# Patient Record
Sex: Female | Born: 1990 | Race: Black or African American | Hispanic: No | Marital: Single | Smoking: Current some day smoker
Health system: Southern US, Community
[De-identification: ages and names within clinical notes are randomized; demographics above are authoritative.]

## PROBLEM LIST (undated history)

## (undated) DIAGNOSIS — R51 Headache: Secondary | ICD-10-CM

## (undated) DIAGNOSIS — R519 Headache, unspecified: Secondary | ICD-10-CM

## (undated) DIAGNOSIS — J45909 Unspecified asthma, uncomplicated: Secondary | ICD-10-CM

---
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
## 2008-03-01 ENCOUNTER — Emergency Department (HOSPITAL_COMMUNITY): Admission: EM | Admit: 2008-03-01 | Discharge: 2008-03-01 | Payer: Self-pay | Admitting: Emergency Medicine

## 2008-03-04 ENCOUNTER — Emergency Department (HOSPITAL_COMMUNITY): Admission: EM | Admit: 2008-03-04 | Discharge: 2008-03-04 | Payer: Self-pay | Admitting: Emergency Medicine

## 2008-03-10 ENCOUNTER — Emergency Department (HOSPITAL_COMMUNITY): Admission: EM | Admit: 2008-03-10 | Discharge: 2008-03-10 | Payer: Self-pay | Admitting: Emergency Medicine

## 2009-12-23 HISTORY — PX: OTHER SURGICAL HISTORY: SHX169

## 2011-09-30 LAB — RAPID STREP SCREEN (MED CTR MEBANE ONLY): Streptococcus, Group A Screen (Direct): NEGATIVE

## 2014-03-05 ENCOUNTER — Emergency Department (HOSPITAL_COMMUNITY)
Admission: EM | Admit: 2014-03-05 | Discharge: 2014-03-05 | Disposition: A | Discharge status: Home / Self Care | Payer: Self-pay | Attending: Emergency Medicine | Admitting: Emergency Medicine

## 2014-03-05 ENCOUNTER — Emergency Department (HOSPITAL_COMMUNITY): Payer: Self-pay

## 2014-03-05 ENCOUNTER — Encounter (HOSPITAL_COMMUNITY): Payer: Self-pay | Admitting: Emergency Medicine

## 2014-03-05 DIAGNOSIS — Y9239 Other specified sports and athletic area as the place of occurrence of the external cause: Secondary | ICD-10-CM | POA: Insufficient documentation

## 2014-03-05 DIAGNOSIS — S8391XA Sprain of unspecified site of right knee, initial encounter: Secondary | ICD-10-CM

## 2014-03-05 DIAGNOSIS — X500XXA Overexertion from strenuous movement or load, initial encounter: Secondary | ICD-10-CM | POA: Insufficient documentation

## 2014-03-05 DIAGNOSIS — Y9367 Activity, basketball: Secondary | ICD-10-CM | POA: Insufficient documentation

## 2014-03-05 DIAGNOSIS — Y92838 Other recreation area as the place of occurrence of the external cause: Secondary | ICD-10-CM

## 2014-03-05 DIAGNOSIS — IMO0002 Reserved for concepts with insufficient information to code with codable children: Secondary | ICD-10-CM | POA: Insufficient documentation

## 2014-03-05 MED ORDER — HYDROCODONE-ACETAMINOPHEN 5-325 MG PO TABS: 2.0000 | ORAL_TABLET | ORAL | Status: DC | PRN

## 2014-03-05 NOTE — Discharge Instructions (Signed)
Rest, ice for 20 minutes every 2 hours while awake for the next 2 days, elevate.  Wear knee immobilizer and use crutches.  Hydrocodone as needed for pain.  Followup with your primary Dr. if not improving in the next week to discuss physical therapy versus further imaging studies.   Knee Sprain A knee sprain is a tear in one of the strong, fibrous tissues that connect the bones (ligaments) in your knee. The severity of the sprain depends on how much of the ligament is torn. The tear can be either partial or complete. CAUSES  Often, sprains are a result of a fall or injury. The force of the impact causes the fibers of your ligament to stretch too much. This excess tension causes the fibers of your ligament to tear. SIGNS AND SYMPTOMS  You may have some loss of motion in your knee. Other symptoms include:  Bruising.  Pain in the knee area.  Tenderness of the knee to the touch.  Swelling. DIAGNOSIS  To diagnose a knee sprain, your health care provider will physically examine your knee. Your health care provider may also suggest an X-ray exam of your knee to make sure no bones are broken. TREATMENT  If your ligament is only partially torn, treatment usually involves keeping the knee in a fixed position (immobilization) or bracing your knee for activities that require movement for several weeks. To do this, your health care provider will apply a bandage, cast, or splint to keep your knee from moving and to support your knee during movement until it heals. For a partially torn ligament, the healing process usually takes 4 6 weeks. If your ligament is completely torn, depending on which ligament it is, you may need surgery to reconnect the ligament to the bone or reconstruct it. After surgery, a cast or splint may be applied and will need to stay on your knee for 4 6 weeks while your ligament heals. HOME CARE INSTRUCTIONS  Keep your injured knee elevated to decrease swelling.  To ease pain  and swelling, apply ice to the injured area:  Put ice in a plastic bag.  Place a towel between your skin and the bag.  Leave the ice on for 20 minutes, 2 3 times a day.  Only take medicine for pain as directed by your health care provider.  Do not leave your knee unprotected until pain and stiffness go away (usually 4 6 weeks).  If you have a cast or splint, do not allow it to get wet. If you have been instructed not to remove it, cover it with a plastic bag when you shower or bathe. Do not swim.  Your health care provider may suggest exercises for you to do during your recovery to prevent or limit permanent weakness and stiffness. SEEK IMMEDIATE MEDICAL CARE IF:  Your cast or splint becomes damaged.  Your pain becomes worse.  You have significant pain, swelling, or numbness below the cast or splint. MAKE SURE YOU:  Understand these instructions.  Will watch your condition.  Will get help right away if you are not doing well or get worse. Document Released: 12/09/2005 Document Revised: 09/29/2013 Document Reviewed: 07/21/2013 [facility] Patient Information 2014 [place], [place].

## 2014-03-05 NOTE — ED Provider Notes (Signed)
CSN: [id number]     Arrival date & time 03/05/14  0706 History   First MD Initiated Contact with Patient 03/05/14 [phone]     Chief Complaint  Patient presents with  . Knee Pain     (Consider location/radiation/quality/duration/timing/severity/associated sxs/prior Treatment) HPI Comments: Patient is a 23 year old female otherwise healthy who presents for evaluation of a knee injury. She is playing basketball yesterday and twisted her right knee awkwardly. She reports feeling something pop and has been having increased pain and swelling since that time. She has difficulty bearing weight. She denies any history of any prior knee problems.  Patient is a 23 y.o. female presenting with knee pain. The history is provided by the patient.  Knee Pain Location:  Knee Time since incident:  1 day Injury: yes   Knee location:  R knee Pain details:    Quality:  Sharp   Radiates to:  Does not radiate   Severity:  Moderate   Onset quality:  Sudden   Timing:  Constant   Progression:  Worsening Chronicity:  New Relieved by:  Nothing Worsened by:  Activity and bearing weight   History reviewed. No pertinent past medical history. History reviewed. No pertinent past surgical history. No family history on file. History  Substance Use Topics  . Smoking status: Never Smoker   . Smokeless tobacco: Not on file  . Alcohol Use: No   OB History   Grav Para Term Preterm Abortions TAB SAB Ect Mult Living                 Review of Systems  All other systems reviewed and are negative.      Allergies  Review of patient's allergies indicates no known allergies.  Home Medications   Current Outpatient Rx  Name  Route  Sig  Dispense  Refill  . acetaminophen (TYLENOL) 325 MG tablet   Oral   Take 650 mg by mouth every 6 (six) hours as needed.         Marland Kitchen ibuprofen (ADVIL,MOTRIN) 200 MG tablet   Oral   Take 200 mg by mouth every 6 (six) hours as needed for moderate pain.          BP 109/97   Pulse 91  Temp(Src) 98.3 F (36.8 C) (Oral)  Resp 20  Ht 5\' 9"  (1.753 m)  Wt 155 lb (70.308 kg)  BMI 22.88 kg/m2  SpO2 97% Physical Exam  Nursing note and vitals reviewed. Constitutional: She is oriented to person, place, and time. She appears well-developed and well-nourished. No distress.  HENT:  Head: Normocephalic and atraumatic.  Neck: Normal range of motion. Neck supple.  Musculoskeletal: She exhibits no edema.  The right knee is noted to have a small effusion present. There is tenderness to palpation over the medial collateral ligament. There is no significant laxity with varus or valgus stress. Anterior and posterior drawer tests are somewhat limited due to pain, however revealed no obvious laxity.  Neurological: She is alert and oriented to person, place, and time.  Skin: Skin is warm and dry. She is not diaphoretic.    ED Course  Procedures (including critical care time) Labs Review Labs Reviewed - No data to display Imaging Review No results found.    MDM   Final diagnoses:  None    Patient is a 23 year old female who injured her knee playing basketball. She appears to have a slight effusion, however there is no laxity to my exam. She has pain with range  of motion but there is no crepitus. She will be placed in a knee immobilizer, given crutches, and will be advised to ice, elevate, and rest her leg. She is not improving in the next week, she will require followup with her primary Dr. to discuss physical therapy versus further imaging.   Geoffery Lyonsouglas Konstantina Nachreiner, MD 03/05/14 [phone]

## 2014-03-05 NOTE — ED Notes (Signed)
Patient transported to X-ray 

## 2014-03-05 NOTE — Progress Notes (Signed)
Orthopedic Tech Progress Note Patient Details:  Rowland LatheMichela A Bollig 04/21/1991 [id number] Knee immobilizer applied. Crutches fit for height and comfort. Ortho Devices Type of Ortho Device: Knee Immobilizer;Crutches Ortho Device/Splint Location: Right LE Ortho Device/Splint Interventions: Application   Asia R Thompson 03/05/2014, 9:40 AM

## 2014-03-05 NOTE — ED Notes (Signed)
Pt presents to department for evaluation of R knee pain. States she injured knee while playing basketball last night. 9/10 pain, increases with weight bearing and movement. No obvious deformities noted. Pt is alert and oriented x4.

## 2014-11-28 ENCOUNTER — Encounter (HOSPITAL_COMMUNITY): Payer: Self-pay | Admitting: *Deleted

## 2014-11-28 ENCOUNTER — Emergency Department (INDEPENDENT_AMBULATORY_CARE_PROVIDER_SITE_OTHER)
Admission: EM | Admit: 2014-11-28 | Discharge: 2014-11-28 | Disposition: A | Discharge status: Home / Self Care | Payer: Self-pay | Source: Home / Self Care | Attending: Family Medicine | Admitting: Family Medicine

## 2014-11-28 DIAGNOSIS — G43909 Migraine, unspecified, not intractable, without status migrainosus: Secondary | ICD-10-CM

## 2014-11-28 HISTORY — DX: Unspecified asthma, uncomplicated: J45.909

## 2014-11-28 HISTORY — DX: Headache: R51

## 2014-11-28 HISTORY — DX: Headache, unspecified: R51.9

## 2014-11-28 MED ORDER — DEXAMETHASONE SODIUM PHOSPHATE 10 MG/ML IJ SOLN
10.0000 mg | Freq: Once | INTRAMUSCULAR | Status: AC
  Administered 2014-11-28: 10 mg via INTRAMUSCULAR

## 2014-11-28 MED ORDER — METOCLOPRAMIDE HCL 5 MG/ML IJ SOLN
5.0000 mg | Freq: Once | INTRAMUSCULAR | Status: AC
  Administered 2014-11-28: 5 mg via INTRAMUSCULAR

## 2014-11-28 MED ORDER — KETOROLAC TROMETHAMINE 60 MG/2ML IM SOLN
60.0000 mg | Freq: Once | INTRAMUSCULAR | Status: AC
  Administered 2014-11-28: 60 mg via INTRAMUSCULAR

## 2014-11-28 MED ORDER — METOCLOPRAMIDE HCL 5 MG/ML IJ SOLN
INTRAMUSCULAR | Status: AC
  Filled 2014-11-28: qty 2

## 2014-11-28 MED ORDER — KETOROLAC TROMETHAMINE 60 MG/2ML IM SOLN
INTRAMUSCULAR | Status: AC
  Filled 2014-11-28: qty 2

## 2014-11-28 MED ORDER — DEXAMETHASONE SODIUM PHOSPHATE 10 MG/ML IJ SOLN
INTRAMUSCULAR | Status: AC
  Filled 2014-11-28: qty 1

## 2014-11-28 MED ORDER — DICLOFENAC SODIUM 50 MG PO TBEC: 50.0000 mg | DELAYED_RELEASE_TABLET | Freq: Two times a day (BID) | ORAL | Status: AC | PRN

## 2014-11-28 NOTE — ED Notes (Signed)
Headache on R side onset today.  Has one 3-4 times/week.  No N or V, but does have photophobia.

## 2014-11-28 NOTE — Discharge Instructions (Signed)
Thank you for coming in today. Go to the emergency room if your headache becomes excruciating or you have weakness or numbness or uncontrolled vomiting.  Please call or see Ms Antionette CharMaggy Mena for assistance with your bill.  You may qualify for reduced or free services.  Her phone number is [phone]. Her email is [email or web address]   [facility] [address] [place], [place] [address] [phone]   Migraine Headache A migraine headache is an intense, throbbing pain on one or both sides of your head. A migraine can last for 30 minutes to several hours. CAUSES  The exact cause of a migraine headache is not always known. However, a migraine may be caused when nerves in the brain become irritated and release chemicals that cause inflammation. This causes pain. Certain things may also trigger migraines, such as:  Alcohol.  Smoking.  Stress.  Menstruation.  Aged cheeses.  Foods or drinks that contain nitrates, glutamate, aspartame, or tyramine.  Lack of sleep.  Chocolate.  Caffeine.  Hunger.  Physical exertion.  Fatigue.  Medicines used to treat chest pain (nitroglycerine), birth control pills, estrogen, and some blood pressure medicines. SIGNS AND SYMPTOMS  Pain on one or both sides of your head.  Pulsating or throbbing pain.  Severe pain that prevents daily activities.  Pain that is aggravated by any physical activity.  Nausea, vomiting, or both.  Dizziness.  Pain with exposure to bright lights, loud noises, or activity.  General sensitivity to bright lights, loud noises, or smells. Before you get a migraine, you may get warning signs that a migraine is coming (aura). An aura may include:  Seeing flashing lights.  Seeing bright spots, halos, or zigzag lines.  Having tunnel vision or blurred vision.  Having feelings of numbness or tingling.  Having trouble talking.  Having muscle  weakness. DIAGNOSIS  A migraine headache is often diagnosed based on:  Symptoms.  Physical exam.  A CT scan or MRI of your head. These imaging tests cannot diagnose migraines, but they can help rule out other causes of headaches. TREATMENT Medicines may be given for pain and nausea. Medicines can also be given to help prevent recurrent migraines.  HOME CARE INSTRUCTIONS  Only take over-the-counter or prescription medicines for pain or discomfort as directed by your health care provider. The use of long-term narcotics is not recommended.  Lie down in a dark, quiet room when you have a migraine.  Keep a journal to find out what may trigger your migraine headaches. For example, write down:  What you eat and drink.  How much sleep you get.  Any change to your diet or medicines.  Limit alcohol consumption.  Quit smoking if you smoke.  Get 7-9 hours of sleep, or as recommended by your health care provider.  Limit stress.  Keep lights dim if bright lights bother you and make your migraines worse. SEEK IMMEDIATE MEDICAL CARE IF:   Your migraine becomes severe.  You have a fever.  You have a stiff neck.  You have vision loss.  You have muscular weakness or loss of muscle control.  You start losing your balance or have trouble walking.  You feel faint or pass out.  You have severe symptoms that are different from your first symptoms. MAKE SURE YOU:   Understand these instructions.  Will watch your condition.  Will get help right away if you are not doing well or get worse. Document Released: 12/09/2005 Document Revised: 04/25/2014 Document Reviewed:  08/16/2013 °ExitCare® Patient Information ©2015 Exit[employer], LLC. This information is not intended to replace advice given to you by your health care provider. Make sure you discuss any questions you have with your health care provider. ° °

## 2014-11-28 NOTE — ED Provider Notes (Signed)
Brittney Duke is a 23 y.o. female who presents to Urgent Care today for Headache. Patient is a severe right-sided headache. This is been present starting today. She has a long history of migraines. Her current headache is similar to previous episodes of migraines. No fevers or chills nausea vomiting or diarrhea. No blurry vision. No trouble balancing slurred speech and weakness or loss of function. She has tried aspirin which did not help much.   Past Medical History  Diagnosis Date  . Asthma   . Headache    Past Surgical History  Procedure Laterality Date  . Cyst brain Left 2011    [facility]   History  Substance Use Topics  . Smoking status: Current Some Day Smoker    Types: Cigarettes  . Smokeless tobacco: Not on file  . Alcohol Use: No   ROS as above Medications: No current facility-administered medications for this encounter.   Current Outpatient Prescriptions  Medication Sig Dispense Refill  . ibuprofen (ADVIL,MOTRIN) 200 MG tablet Take 200 mg by mouth every 6 (six) hours as needed for moderate pain.    Marland Kitchen. diclofenac (VOLTAREN) 50 MG EC tablet Take 1 tablet (50 mg total) by mouth 2 (two) times daily as needed. 60 tablet 0   Allergies  Allergen Reactions  . Tylenol [Acetaminophen] Hives     Exam:  BP 121/80 mmHg  Pulse 62  Temp(Src) 98 F (36.7 C) (Oral)  Resp 14  SpO2 100%  LMP 10/27/2014 Gen: Well NAD HEENT: EOMI,  MMM Perrl.  Lungs: Normal work of breathing. CTABL Heart: RRR no MRG Abd: NABS, Soft. Nondistended, Nontender Exts: Brisk capillary refill, warm and well perfused.  Neuro: Alert and oriented cranial nerves II through XII are intact normal coordination strength sensation reflexes balance and gait.  Patient was given 60 mg of IM Toradol, 5 mg of IM Reglan, and 10 mg of IM dexamethasone, and felt a little better  No results found for this or any previous visit (from the past 24 hour(s)). No results found.  Assessment and Plan: 23 y.o. female  with migraine headache. Treatment with medications as above. Additionally diclofenac. Follow up with primary care provider as needed.  Discussed warning signs or symptoms. Please see discharge instructions. Patient expresses understanding.     Brittney BongEvan S Saafir Abdullah, MD 11/28/14 2007

## 2015-02-15 ENCOUNTER — Encounter (HOSPITAL_COMMUNITY): Payer: Self-pay | Admitting: *Deleted

## 2015-02-15 ENCOUNTER — Emergency Department (HOSPITAL_COMMUNITY)
Admission: EM | Admit: 2015-02-15 | Discharge: 2015-02-16 | Disposition: A | Discharge status: Home / Self Care | Payer: Self-pay | Attending: Emergency Medicine | Admitting: Emergency Medicine

## 2015-02-15 DIAGNOSIS — Y939 Activity, unspecified: Secondary | ICD-10-CM | POA: Insufficient documentation

## 2015-02-15 DIAGNOSIS — X58XXXA Exposure to other specified factors, initial encounter: Secondary | ICD-10-CM | POA: Insufficient documentation

## 2015-02-15 DIAGNOSIS — Y998 Other external cause status: Secondary | ICD-10-CM | POA: Insufficient documentation

## 2015-02-15 DIAGNOSIS — Z720 Tobacco use: Secondary | ICD-10-CM | POA: Insufficient documentation

## 2015-02-15 DIAGNOSIS — Y9289 Other specified places as the place of occurrence of the external cause: Secondary | ICD-10-CM | POA: Insufficient documentation

## 2015-02-15 DIAGNOSIS — S0501XA Injury of conjunctiva and corneal abrasion without foreign body, right eye, initial encounter: Secondary | ICD-10-CM | POA: Insufficient documentation

## 2015-02-15 DIAGNOSIS — J45909 Unspecified asthma, uncomplicated: Secondary | ICD-10-CM | POA: Insufficient documentation

## 2015-02-15 MED ORDER — FLUORESCEIN SODIUM 1 MG OP STRP
1.0000 | ORAL_STRIP | Freq: Once | OPHTHALMIC | Status: AC
  Administered 2015-02-15: 1 via OPHTHALMIC

## 2015-02-15 MED ORDER — TETRACAINE HCL 0.5 % OP SOLN
2.0000 [drp] | Freq: Once | OPHTHALMIC | Status: AC
  Administered 2015-02-15: 2 [drp] via OPHTHALMIC
  Filled 2015-02-15: qty 2

## 2015-02-15 MED ORDER — FLUORESCEIN-BENOXINATE 0.25-0.4 % OP SOLN
2.0000 [drp] | Freq: Once | OPHTHALMIC | Status: DC
  Filled 2015-02-15: qty 5

## 2015-02-15 MED ORDER — OXYCODONE HCL 5 MG PO TABS
5.0000 mg | ORAL_TABLET | Freq: Once | ORAL | Status: AC
  Administered 2015-02-15: 5 mg via ORAL
  Filled 2015-02-15: qty 1

## 2015-02-15 NOTE — ED Notes (Signed)
Pt reports pain in right eye since she woke up this morning.  Pt unable to open eye, has blurry vision and can only "see straight ahead of me."  Pt denies any other neurological symptoms. Pt alert and oriented.

## 2015-02-15 NOTE — ED Notes (Signed)
Pt tearfully c/o right eye pain starting today. Pt denies any injury to right eye. Pt noticed pain when she woke up. No swelling or injury noted to right eye. Pt reports pain increases with any eye movement.

## 2015-02-15 NOTE — ED Provider Notes (Signed)
CSN: [id number]     Arrival date & time 02/15/15  2147 History  This chart was scribed for non-physician practitioner, Brittney Duke. Brittney Bachelor, PA-C working with Brittney Chick, MD by Brittney Duke, ED scribe. This patient was seen in room TR04C/TR04C and the patient's care was started at 10:43 PM   Chief Complaint  Patient presents with  . Eye Pain   The history is provided by the patient. No language interpreter was used.   HPI Comments: Brittney Duke is a 24 y.o. female with a history of asthma and HA who presents to the Emergency Department complaining of constant, moderate right eye pain that started this morning. She reports blurred vision as an associated symptom. Pt states the pain becomes worse with opening and moving her eye. She denies improvement with pontocaine administered in the ED. Pt denies recent injuries, scratching or itching her eye. She also denies a history of similar pain, allergies, EtOH use and drug use. Pt is UTD on her tetanus vaccine. She smokes cigarettes.    Past Medical History  Diagnosis Date  . Asthma   . Headache    Past Surgical History  Procedure Laterality Date  . Cyst brain Left 2011    Brittney Duke.   History reviewed. No pertinent family history. History  Substance Use Topics  . Smoking status: Current Some Day Smoker    Types: Cigarettes  . Smokeless tobacco: Not on file  . Alcohol Use: No   OB History    No data available     Review of Systems  HENT: Negative for facial swelling.   Eyes: Positive for pain and visual disturbance. Negative for itching.  All other systems reviewed and are negative.     Allergies  Tylenol  Home Medications   Prior to Admission medications   Medication Sig Start Date End Date Taking? Authorizing Provider  diclofenac (VOLTAREN) 50 MG EC tablet Take 1 tablet (50 mg total) by mouth 2 (two) times daily as needed. 11/28/14   Brittney Bong, MD  ibuprofen (ADVIL,MOTRIN) 200 MG tablet Take 200 mg by mouth every 6  (six) hours as needed for moderate pain.    Historical Provider, MD   BP 142/86 mmHg  Pulse 78  Temp(Src) 98.4 F (36.9 C) (Oral)  Resp 24  Ht  (1.727 m)  Wt 150 lb (68.04 kg)  BMI 22.81 kg/m2  SpO2 100%  LMP 02/12/2015 Physical Exam  Constitutional: She appears well-developed and well-nourished. No distress.  HENT:  Head: Normocephalic and atraumatic.  Eyes: Conjunctivae and EOM are normal.  fluroscein uptake   Neck: Neck supple. No tracheal deviation present.  Cardiovascular: Normal rate.   Pulmonary/Chest: Effort normal. No respiratory distress.  Skin: Skin is warm and dry.  Psychiatric: She has a normal mood and affect. Her behavior is normal.  Nursing note and vitals reviewed.   ED Course  Procedures  DIAGNOSTIC STUDIES: Oxygen Saturation is 100% on RA, normal by my interpretation.    COORDINATION OF CARE: 11:03 PM Discussed treatment plan with pt at bedside and pt agreed to plan.   Labs Review Labs Reviewed - No data to display  Imaging Review No results found.   EKG Interpretation None      MDM  Tetracaine 2 drop by rn, repeat by me.  Pt crying and tearing,  No relief.  fluroscein i am unable to do exam.  Pt given oxycodone for pain.  Rexam. fluroscein drop  Small abrasion,  Eye lids  no foreign body. Swabbed.   Final diagnoses:  Corneal abrasion, right, initial encounter      I personally performed the services in this documentation, which was scribed in my presence.  The recorded information has been reviewed and considered.   Brittney Duke.  Brittney SkinnerLeslie K Canadohta LakeSofia, PA-C 02/16/15 0012  Brittney ChickMartha K Linker, MD 02/16/15 [phone]

## 2015-02-16 MED ORDER — HYDROCODONE-IBUPROFEN 7.5-200 MG PO TABS: 1.0000 | ORAL_TABLET | Freq: Four times a day (QID) | ORAL | Status: AC | PRN

## 2015-02-16 MED ORDER — TOBRAMYCIN 0.3 % OP SOLN: 2.0000 [drp] | OPHTHALMIC | Status: AC

## 2015-02-16 NOTE — ED Notes (Signed)
Pt made aware to return if symptoms worsen or if any life threatening symptoms occur.   

## 2015-02-16 NOTE — Discharge Instructions (Signed)
Corneal Abrasion °The cornea is the clear covering at the front and center of the eye. When looking at the colored portion of the eye (iris), you are looking through the cornea. This very thin tissue is made up of many layers. The surface layer is a single layer of cells (corneal epithelium) and is one of the most sensitive tissues in the body. If a scratch or injury causes the corneal epithelium to come off, it is called a corneal abrasion. If the injury extends to the tissues below the epithelium, the condition is called a corneal ulcer. °CAUSES  °· Scratches. °· Trauma. °· Foreign body in the eye. °Some people have recurrences of abrasions in the area of the original injury even after it has healed (recurrent erosion syndrome). Recurrent erosion syndrome generally improves and goes away with time. °SYMPTOMS  °· Eye pain. °· Difficulty or inability to keep the injured eye open. °· The eye becomes very sensitive to light. °· Recurrent erosions tend to happen suddenly, first thing in the morning, usually after waking up and opening the eye. °DIAGNOSIS  °Your health care provider can diagnose a corneal abrasion during an eye exam. Dye is usually placed in the eye using a drop or a small paper strip moistened by your tears. When the eye is examined with a special light, the abrasion shows up clearly because of the dye. °TREATMENT  °· Small abrasions may be treated with antibiotic drops or ointment alone. °· A pressure patch may be put over the eye. If this is done, follow your doctor's instructions for when to remove the patch. Do not drive or use machines while the eye patch is on. Judging distances is hard to do with a patch on. °If the abrasion becomes infected and spreads to the deeper tissues of the cornea, a corneal ulcer can result. This is serious because it can cause corneal scarring. Corneal scars interfere with light passing through the cornea and cause a loss of vision in the involved eye. °HOME CARE  INSTRUCTIONS °· Use medicine or ointment as directed. Only take over-the-counter or prescription medicines for pain, discomfort, or fever as directed by your health care provider. °· Do not drive or operate machinery if your eye is patched. Your ability to judge distances is impaired. °· If your health care provider has given you a follow-up appointment, it is very important to keep that appointment. Not keeping the appointment could result in a severe eye infection or permanent loss of vision. If there is any problem keeping the appointment, let your health care provider know. °SEEK MEDICAL CARE IF:  °· You have pain, light sensitivity, and a scratchy feeling in one eye or both eyes. °· Your pressure patch keeps loosening up, and you can blink your eye under the patch after treatment. °· Any kind of discharge develops from the eye after treatment or if the lids stick together in the morning. °· You have the same symptoms in the morning as you did with the original abrasion days, weeks, or months after the abrasion healed. °MAKE SURE YOU:  °· Understand these instructions. °· Will watch your condition. °· Will get help right away if you are not doing well or get worse. °Document Released: 12/06/2000 Document Revised: 12/14/2013 Document Reviewed: 08/16/2013 °ExitCare® Patient Information ©2015 [employer], [employer]. This information is not intended to replace advice given to you by your health care provider. Make sure you discuss any questions you have with your health care provider. ° °

## 2017-07-04 ENCOUNTER — Emergency Department (HOSPITAL_COMMUNITY)
Admission: EM | Admit: 2017-07-04 | Discharge: 2017-07-05 | Disposition: A | Discharge status: Home / Self Care | Payer: No Typology Code available for payment source | Attending: Emergency Medicine | Admitting: Emergency Medicine

## 2017-07-04 ENCOUNTER — Emergency Department (HOSPITAL_COMMUNITY): Payer: No Typology Code available for payment source

## 2017-07-04 ENCOUNTER — Encounter (HOSPITAL_COMMUNITY): Payer: Self-pay | Admitting: *Deleted

## 2017-07-04 DIAGNOSIS — M546 Pain in thoracic spine: Secondary | ICD-10-CM | POA: Insufficient documentation

## 2017-07-04 DIAGNOSIS — M542 Cervicalgia: Secondary | ICD-10-CM | POA: Insufficient documentation

## 2017-07-04 DIAGNOSIS — Y999 Unspecified external cause status: Secondary | ICD-10-CM | POA: Insufficient documentation

## 2017-07-04 DIAGNOSIS — M6281 Muscle weakness (generalized): Secondary | ICD-10-CM | POA: Diagnosis not present

## 2017-07-04 DIAGNOSIS — Y92410 Unspecified street and highway as the place of occurrence of the external cause: Secondary | ICD-10-CM | POA: Insufficient documentation

## 2017-07-04 DIAGNOSIS — R52 Pain, unspecified: Secondary | ICD-10-CM

## 2017-07-04 DIAGNOSIS — Y939 Activity, unspecified: Secondary | ICD-10-CM | POA: Insufficient documentation

## 2017-07-04 LAB — CBC
HEMATOCRIT: 34.3 % — AB (ref 36.0–46.0)
Hemoglobin: 11.3 g/dL — ABNORMAL LOW (ref 12.0–15.0)
MCH: 27.3 pg (ref 26.0–34.0)
MCHC: 32.9 g/dL (ref 30.0–36.0)
MCV: 82.9 fL (ref 78.0–100.0)
PLATELETS: 205 10*3/uL (ref 150–400)
RBC: 4.14 MIL/uL (ref 3.87–5.11)
RDW: 15 % (ref 11.5–15.5)
WBC: 7.9 10*3/uL (ref 4.0–10.5)

## 2017-07-04 LAB — I-STAT CHEM 8, ED
BUN: 8 mg/dL (ref 6–20)
CALCIUM ION: 1.14 mmol/L — AB (ref 1.15–1.40)
CREATININE: 0.9 mg/dL (ref 0.44–1.00)
Chloride: 105 mmol/L (ref 101–111)
GLUCOSE: 96 mg/dL (ref 65–99)
HCT: 35 % — ABNORMAL LOW (ref 36.0–46.0)
HEMOGLOBIN: 11.9 g/dL — AB (ref 12.0–15.0)
POTASSIUM: 3.5 mmol/L (ref 3.5–5.1)
Sodium: 141 mmol/L (ref 135–145)
TCO2: 24 mmol/L (ref 0–100)

## 2017-07-04 LAB — I-STAT BETA HCG BLOOD, ED (MC, WL, AP ONLY)

## 2017-07-04 MED ORDER — SODIUM CHLORIDE 0.9 % IV SOLN
INTRAVENOUS | Status: DC
  Administered 2017-07-05: 01:00:00 via INTRAVENOUS

## 2017-07-04 MED ORDER — IOPAMIDOL (ISOVUE-300) INJECTION 61%
INTRAVENOUS | Status: AC
  Administered 2017-07-05: 100 mL
  Filled 2017-07-04: qty 100
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
## 2017-07-04 NOTE — ED Notes (Signed)
Pt hard to understand  Crying  C/o neck back and leg pain.  c-collar in place
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
## 2017-07-05 ENCOUNTER — Emergency Department (HOSPITAL_COMMUNITY): Payer: No Typology Code available for payment source

## 2017-07-05 ENCOUNTER — Encounter (HOSPITAL_COMMUNITY): Payer: Self-pay

## 2017-07-05 ENCOUNTER — Other Ambulatory Visit (HOSPITAL_COMMUNITY): Payer: Self-pay
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
## 2017-07-05 NOTE — ED Notes (Signed)
Patient transported to CT 

## 2017-07-05 NOTE — Discharge Instructions (Signed)
Take ibuprofen if you develop pain. You can ice the painful areas as well. Return to the ER if you notice any worsening or new symptoms.

## 2017-07-05 NOTE — ED Notes (Signed)
Patient transported to X-ray 

## 2019-03-30 IMAGING — DX DG TIBIA/FIBULA 2V*R*
1 series · 1 of 1 positions shown · non-contrast
Comparison: None.

CLINICAL DATA: Weakness in the legs

EXAM:
RIGHT TIBIA AND FIBULA - 2 VIEW

[tibia lat]
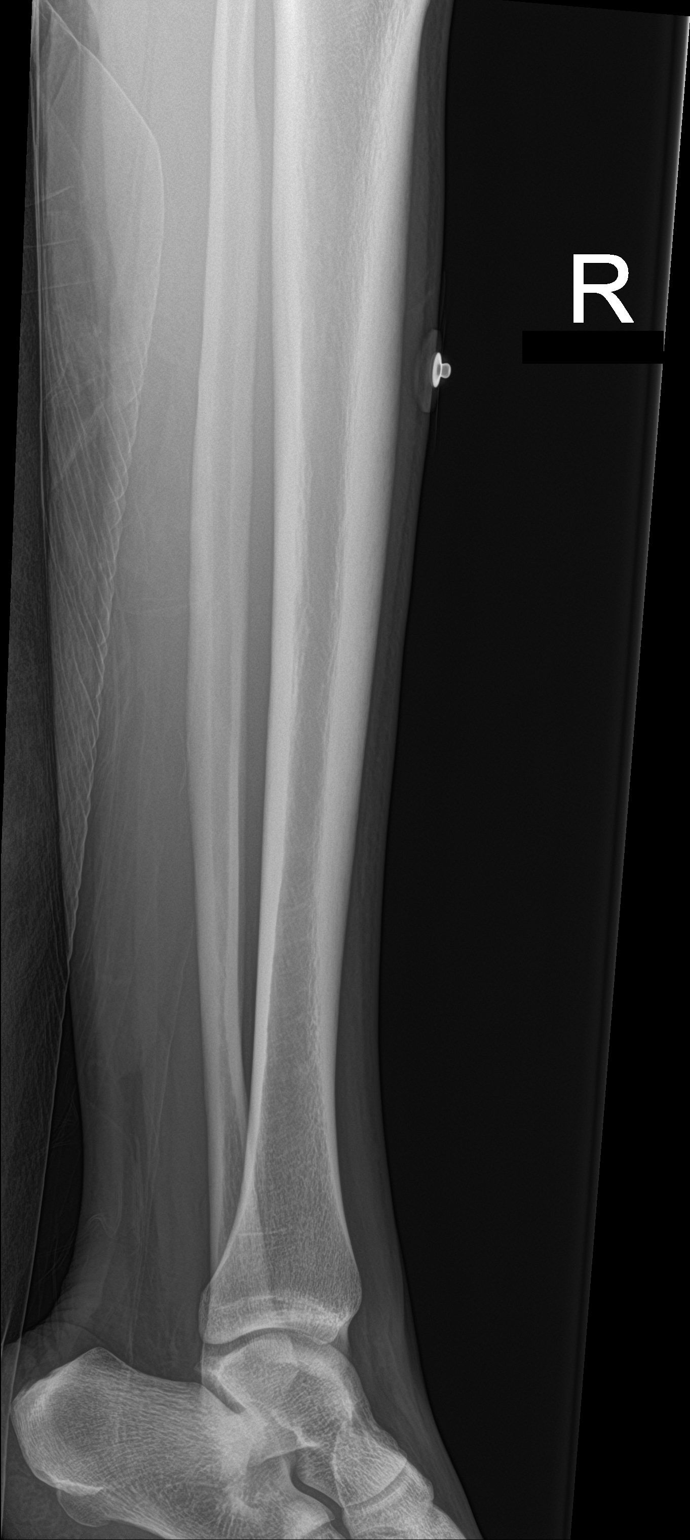

[1 of 1 positions shown; findings below may reference images not displayed]

FINDINGS: There is no evidence of fracture or other focal bone lesions. Soft
tissues are unremarkable.
IMPRESSION: Negative.
